# Patient Record
Sex: Female | Born: 1985 | Race: Black or African American | Hispanic: No | Marital: Single | Smoking: Current every day smoker
Health system: Southern US, Community
[De-identification: ages and names within clinical notes are randomized; demographics above are authoritative.]

## PROBLEM LIST (undated history)

## (undated) DIAGNOSIS — I10 Essential (primary) hypertension: Secondary | ICD-10-CM

---

## 2017-07-03 ENCOUNTER — Encounter (HOSPITAL_BASED_OUTPATIENT_CLINIC_OR_DEPARTMENT_OTHER): Payer: Self-pay | Admitting: Emergency Medicine

## 2017-07-03 ENCOUNTER — Other Ambulatory Visit: Payer: Self-pay

## 2017-07-03 ENCOUNTER — Emergency Department (HOSPITAL_BASED_OUTPATIENT_CLINIC_OR_DEPARTMENT_OTHER)
Admission: EM | Admit: 2017-07-03 | Discharge: 2017-07-03 | Disposition: A | Discharge status: Home / Self Care | Payer: 59 | Attending: Emergency Medicine | Admitting: Emergency Medicine

## 2017-07-03 ENCOUNTER — Emergency Department (HOSPITAL_BASED_OUTPATIENT_CLINIC_OR_DEPARTMENT_OTHER): Payer: 59

## 2017-07-03 DIAGNOSIS — Y92410 Unspecified street and highway as the place of occurrence of the external cause: Secondary | ICD-10-CM | POA: Diagnosis not present

## 2017-07-03 DIAGNOSIS — F17200 Nicotine dependence, unspecified, uncomplicated: Secondary | ICD-10-CM | POA: Insufficient documentation

## 2017-07-03 DIAGNOSIS — I10 Essential (primary) hypertension: Secondary | ICD-10-CM | POA: Insufficient documentation

## 2017-07-03 DIAGNOSIS — M25552 Pain in left hip: Secondary | ICD-10-CM | POA: Diagnosis present

## 2017-07-03 DIAGNOSIS — Z79899 Other long term (current) drug therapy: Secondary | ICD-10-CM | POA: Insufficient documentation

## 2017-07-03 DIAGNOSIS — Y939 Activity, unspecified: Secondary | ICD-10-CM | POA: Insufficient documentation

## 2017-07-03 DIAGNOSIS — Y998 Other external cause status: Secondary | ICD-10-CM | POA: Diagnosis not present

## 2017-07-03 HISTORY — DX: Essential (primary) hypertension: I10

## 2017-07-03 MED ORDER — NAPROXEN 500 MG PO TABS: 500.0000 mg | ORAL_TABLET | Freq: Two times a day (BID) | ORAL | 0 refills | Status: AC

## 2017-07-03 MED ORDER — NAPROXEN 250 MG PO TABS
500.0000 mg | ORAL_TABLET | Freq: Once | ORAL | Status: AC
  Administered 2017-07-03: 500 mg via ORAL
  Filled 2017-07-03: qty 2

## 2017-07-03 NOTE — ED Notes (Signed)
UDS completed. RN Windell Mouldinguth and Supervisor ArvinMeritorSherri Toney informed that we do not have someone to perform the BAT test. Per Laveda Normanoney unaware of want type of test to do non- DOT or DOT, she stated to go ahead an do a DOT UDS.
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
## 2017-10-26 ENCOUNTER — Encounter (HOSPITAL_COMMUNITY): Payer: Self-pay

## 2017-10-26 ENCOUNTER — Ambulatory Visit (HOSPITAL_COMMUNITY)
Admission: EM | Admit: 2017-10-26 | Discharge: 2017-10-26 | Disposition: A | Discharge status: Home / Self Care | Payer: 59 | Attending: Internal Medicine | Admitting: Internal Medicine

## 2017-10-26 DIAGNOSIS — R05 Cough: Secondary | ICD-10-CM | POA: Diagnosis present

## 2017-10-26 DIAGNOSIS — I10 Essential (primary) hypertension: Secondary | ICD-10-CM | POA: Insufficient documentation

## 2017-10-26 DIAGNOSIS — J22 Unspecified acute lower respiratory infection: Secondary | ICD-10-CM

## 2017-10-26 DIAGNOSIS — Z2089 Contact with and (suspected) exposure to other communicable diseases: Secondary | ICD-10-CM | POA: Diagnosis not present

## 2017-10-26 DIAGNOSIS — Z79899 Other long term (current) drug therapy: Secondary | ICD-10-CM | POA: Diagnosis not present

## 2017-10-26 DIAGNOSIS — R0602 Shortness of breath: Secondary | ICD-10-CM

## 2017-10-26 DIAGNOSIS — J069 Acute upper respiratory infection, unspecified: Secondary | ICD-10-CM | POA: Insufficient documentation

## 2017-10-26 DIAGNOSIS — T7589XA Other specified effects of external causes, initial encounter: Secondary | ICD-10-CM | POA: Insufficient documentation

## 2017-10-26 DIAGNOSIS — F17200 Nicotine dependence, unspecified, uncomplicated: Secondary | ICD-10-CM | POA: Insufficient documentation

## 2017-10-26 DIAGNOSIS — X58XXXA Exposure to other specified factors, initial encounter: Secondary | ICD-10-CM | POA: Diagnosis not present

## 2017-10-26 MED ORDER — ALBUTEROL SULFATE HFA 108 (90 BASE) MCG/ACT IN AERS
2.0000 | INHALATION_SPRAY | Freq: Once | RESPIRATORY_TRACT | Status: AC
  Administered 2017-10-26: 2 via RESPIRATORY_TRACT

## 2017-10-26 MED ORDER — AZITHROMYCIN 250 MG PO TABS: 250.0000 mg | ORAL_TABLET | Freq: Every day | ORAL | 0 refills | Status: AC

## 2017-10-26 MED ORDER — ALBUTEROL SULFATE HFA 108 (90 BASE) MCG/ACT IN AERS
INHALATION_SPRAY | RESPIRATORY_TRACT | Status: AC
  Filled 2017-10-26: qty 6.7

## 2017-10-26 NOTE — ED Triage Notes (Signed)
Pt presents with cough.  

## 2017-10-26 NOTE — Discharge Instructions (Addendum)
Legionellosis urine antigen test ordered We will follow up with you regarding the results  Albuterol inhaler given.  Use as needed for SOB or wheezing Get plenty of rest and push fluids Use OTC medication as needed for symptomatic relief Take antibiotic as directed and to completion Follow up with PCP if next week for reevaluation Return or go to ER if you have any new or worsening symptoms

## 2017-10-26 NOTE — ED Provider Notes (Signed)
[facility] CARE [facility]ENTER   [id number] 10/26/17 Arrival Time: 1016  SUBJECTIVE:  Anna Watson is a 32 y.o. female  who presents with cough that began 1 day ago.  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  Describes cough as constant and productive.  Has not tried OTC medications.  Denies aggravating or alleviating factors.  Denies previous symptoms in the past.  Complains of fatigue, nausea, rhinorrhea, chest pain, SOB, wheezing, increased urination, and diarrhea. Denies fever, chills, sinus pain, or sore throat.    ROS: As per HPI.  Past Medical History:  Diagnosis Date  . Hypertension    History reviewed. No pertinent surgical history. No Known Allergies No current facility-administered medications on file prior to encounter.    Current Outpatient Medications on File Prior to Encounter  Medication Sig Dispense Refill  . cetirizine (ZYRTEC) 10 MG tablet Take 10 mg by mouth daily.    . hydrochlorothiazide (MICROZIDE) 12.5 MG capsule Take 12.5 mg by mouth daily.    Marland Kitchen lisinopril (PRINIVIL,ZESTRIL) 10 MG tablet Take 10 mg by mouth daily.    . naproxen (NAPROSYN) 500 MG tablet Take 1 tablet (500 mg total) by mouth 2 (two) times daily. 30 tablet 0  . phentermine 15 MG capsule Take 15 mg by mouth every morning.      Social History   Socioeconomic History  . Marital status: Single    Spouse name: Not on file  . Number of children: Not on file  . Years of education: Not on file  . Highest education level: Not on file  Occupational History  . Not on file  Social Needs  . Financial resource strain: Not on file  . Food insecurity:    Worry: Not on file    Inability: Not on file  . Transportation needs:    Medical: Not on file    Non-medical: Not on file  Tobacco Use  . Smoking status: Current Every Day Smoker  . Smokeless tobacco: Never Used  Substance and Sexual Activity  . Alcohol use: Not Currently    Frequency:  Never  . Drug use: Never  . Sexual activity: Not on file  Lifestyle  . Physical activity:    Days per week: Not on file    Minutes per session: Not on file  . Stress: Not on file  Relationships  . Social connections:    Talks on phone: Not on file    Gets together: Not on file    Attends religious service: Not on file    Active member of club or organization: Not on file    Attends meetings of clubs or organizations: Not on file    Relationship status: Not on file  . Intimate partner violence:    Fear of current or ex partner: Not on file    Emotionally abused: Not on file    Physically abused: Not on file    Forced sexual activity: Not on file  Other Topics Concern  . Not on file  Social History Narrative  . Not on file   History reviewed. No pertinent family history.   OBJECTIVE:  Vitals:   10/26/17 1033  BP: 131/86  Pulse: (!) 113  Resp: 20  Temp: 98.7 F (37.1 C)  TempSrc: Oral  SpO2: 97%     General appearance: AOx3 in no acute distress; appears fatigued HEENT: PERRL.  EOM grossly intact.  Sinuses nontender; mild clear rhinorrhea; tonsils nonerythematous, uvula midline  Neck: supple without LAD Lungs: clear to auscultation bilaterally without adventitious breath sounds Heart: regular rate and rhythm.  Radial pulses 2+ symmetrical bilaterally Abdomen: Soft non-distended; normal active BS; nontender to light palpation; no guarding  Skin: warm and dry Psychological: alert and cooperative; normal mood and affect   EKG:  NSR without obvious ST elevations, depressions or t-wave inversion.  No past EKG for comparison.  90 bpm on EKG.  Reviewed with Dr. Dayton ScrapeMurray  ASSESSMENT & PLAN:  1. Acute lower respiratory infection   2. 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 albuterol (PROVENTIL HFA;VENTOLIN HFA) 108 (90 Base) MCG/ACT inhaler 2 puff  . azithromycin (ZITHROMAX) 250 MG tablet    Sig:  Take 1 tablet (250 mg total) by mouth daily. Take first 2 tablets together, then 1 every day until finished.    Dispense:  6 tablet    Refill:  0    Order Specific Question:   Supervising Provider    Answer:   Isa RankinMURRAY, LAURA WILSON [[id number]]    Unresulted Labs (From admission, onward)   Start     Ordered   10/26/17 1106  Legionella Pneumophila Serogp 1 Ur Ag  Once,   R     10/26/17 1105     Legionellosis urine antigen test ordered We will follow up with you regarding the results  Albuterol inhaler given.  Use as needed for SOB or wheezing Get plenty of rest and push fluids Use OTC medication as needed for symptomatic relief Take antibiotic as directed and to completion Follow up with PCP if next week for reevaluation Return or go to ER if you have any new or worsening symptoms  Reviewed expectations re: course of current medical issues. Questions answered. Outlined signs and symptoms indicating need for more acute intervention. Patient verbalized understanding. After Visit Summary given.          Rennis HardingWurst, Anwita Mencer, PA-C 10/26/17 1205

## 2017-10-28 LAB — LEGIONELLA PNEUMOPHILA SEROGP 1 UR AG: L. pneumophila Serogp 1 Ur Ag: NEGATIVE

## 2019-03-18 ENCOUNTER — Emergency Department (HOSPITAL_COMMUNITY): Payer: 59

## 2019-03-18 ENCOUNTER — Emergency Department (HOSPITAL_COMMUNITY)
Admission: EM | Admit: 2019-03-18 | Discharge: 2019-03-18 | Disposition: A | Discharge status: Home / Self Care | Payer: 59 | Attending: Emergency Medicine | Admitting: Emergency Medicine

## 2019-03-18 DIAGNOSIS — F17210 Nicotine dependence, cigarettes, uncomplicated: Secondary | ICD-10-CM | POA: Insufficient documentation

## 2019-03-18 DIAGNOSIS — R519 Headache, unspecified: Secondary | ICD-10-CM | POA: Insufficient documentation

## 2019-03-18 DIAGNOSIS — Y93I9 Activity, other involving external motion: Secondary | ICD-10-CM | POA: Insufficient documentation

## 2019-03-18 DIAGNOSIS — Y92414 Local residential or business street as the place of occurrence of the external cause: Secondary | ICD-10-CM | POA: Insufficient documentation

## 2019-03-18 DIAGNOSIS — M7918 Myalgia, other site: Secondary | ICD-10-CM

## 2019-03-18 DIAGNOSIS — I10 Essential (primary) hypertension: Secondary | ICD-10-CM | POA: Diagnosis not present

## 2019-03-18 DIAGNOSIS — M7912 Myalgia of auxiliary muscles, head and neck: Secondary | ICD-10-CM | POA: Diagnosis not present

## 2019-03-18 DIAGNOSIS — Y999 Unspecified external cause status: Secondary | ICD-10-CM | POA: Insufficient documentation

## 2019-03-18 DIAGNOSIS — Z79899 Other long term (current) drug therapy: Secondary | ICD-10-CM | POA: Insufficient documentation

## 2019-03-18 LAB — I-STAT BETA HCG BLOOD, ED (NOT ORDERABLE): I-stat hCG, quantitative: <5 m[IU]/mL (ref <5)

## 2019-03-18 MED ORDER — CYCLOBENZAPRINE HCL 10 MG PO TABS: 10.0000 mg | ORAL_TABLET | Freq: Two times a day (BID) | ORAL | 0 refills | Status: AC | PRN

## 2019-03-18 MED ORDER — CYCLOBENZAPRINE HCL 10 MG PO TABS
10.0000 mg | ORAL_TABLET | Freq: Once | ORAL | Status: AC
  Administered 2019-03-18: 10 mg via ORAL
  Filled 2019-03-18: qty 1
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
## 2019-03-18 NOTE — Discharge Instructions (Signed)
Take 800 mg of Motrin every 8 hours for the next 5 days and then as needed.  Take 1000 mg of Tylenol every 6 hours as needed for pain.  Take Flexeril as prescribed.  Do not mix this medication with drugs or alcohol.

## 2019-03-18 NOTE — ED Notes (Signed)
[place] MD at bedside 

## 2019-03-18 NOTE — ED Notes (Signed)
Spoke to [place], MD for a order for radiology.   Md will place order.

## 2019-03-18 NOTE — ED Notes (Signed)
Pt verbalizes understanding of DC instructions. Pt belongings returned and is ambulatory out of ED.  
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
